# Patient Record
Sex: Male | Born: 1970 | Race: White | Hispanic: No | Smoking: Never smoker
Health system: Southern US, Community
[De-identification: ages and names within clinical notes are randomized; demographics above are authoritative.]

## PROBLEM LIST (undated history)

## (undated) DIAGNOSIS — G40909 Epilepsy, unspecified, not intractable, without status epilepticus: Secondary | ICD-10-CM

## (undated) HISTORY — PX: APPENDECTOMY: SHX54

## (undated) HISTORY — PX: FEMUR FRACTURE SURGERY: SHX633

## (undated) HISTORY — PX: KNEE SURGERY: SHX244

---

## 2008-05-20 ENCOUNTER — Emergency Department (HOSPITAL_BASED_OUTPATIENT_CLINIC_OR_DEPARTMENT_OTHER): Admission: EM | Admit: 2008-05-20 | Discharge: 2008-05-20 | Payer: Self-pay | Admitting: Emergency Medicine

## 2008-10-19 ENCOUNTER — Other Ambulatory Visit: Payer: Self-pay | Admitting: Emergency Medicine

## 2008-10-20 ENCOUNTER — Inpatient Hospital Stay (HOSPITAL_COMMUNITY): Admission: EM | Admit: 2008-10-20 | Discharge: 2008-10-26 | Payer: Self-pay | Admitting: *Deleted

## 2008-10-20 ENCOUNTER — Ambulatory Visit: Payer: Self-pay | Admitting: *Deleted

## 2009-09-19 ENCOUNTER — Emergency Department (HOSPITAL_COMMUNITY): Admission: EM | Admit: 2009-09-19 | Discharge: 2009-09-19 | Payer: Self-pay | Admitting: Emergency Medicine

## 2010-07-04 LAB — DIFFERENTIAL
Eosinophils Relative: 6 % — ABNORMAL HIGH (ref 0–5)
Lymphocytes Relative: 28 % (ref 12–46)
Lymphs Abs: 2.2 10*3/uL (ref 0.7–4.0)
Neutro Abs: 4.4 10*3/uL (ref 1.7–7.7)

## 2010-07-04 LAB — COMPREHENSIVE METABOLIC PANEL
ALT: 43 U/L (ref 0–53)
Alkaline Phosphatase: 146 U/L — ABNORMAL HIGH (ref 39–117)
CO2: 23 mEq/L (ref 19–32)
Calcium: 9.2 mg/dL (ref 8.4–10.5)
Chloride: 107 mEq/L (ref 96–112)
Creatinine, Ser: 0.7 mg/dL (ref 0.4–1.5)
GFR calc Af Amer: >60 mL/min (ref >60)
GFR calc non Af Amer: >60 mL/min (ref >60)
Total Protein: 6.9 g/dL (ref 6.0–8.3)

## 2010-07-04 LAB — CBC
MCV: 92.8 fL (ref 78.0–100.0)
Platelets: 285 10*3/uL (ref 150–400)
RBC: 4.21 MIL/uL — ABNORMAL LOW (ref 4.22–5.81)
RDW: 14.9 % (ref 11.5–15.5)
WBC: 7.8 10*3/uL (ref 4.0–10.5)

## 2010-07-04 LAB — RAPID URINE DRUG SCREEN, HOSP PERFORMED
Amphetamines: NOT DETECTED
Benzodiazepines: NOT DETECTED

## 2010-07-04 LAB — PHENYTOIN LEVEL, TOTAL: Phenytoin Lvl: 6.4 ug/mL — ABNORMAL LOW (ref 10.0–20.0)

## 2010-07-04 LAB — ETHANOL: Alcohol, Ethyl (B): <5 mg/dL (ref 0–10)

## 2010-07-24 LAB — VALPROIC ACID LEVEL
Valproic Acid Lvl: 133.1 ug/mL — ABNORMAL HIGH (ref 50.0–100.0)
Valproic Acid Lvl: 93.5 ug/mL (ref 50.0–100.0)

## 2010-07-24 LAB — POCT TOXICOLOGY PANEL

## 2010-07-24 LAB — HEPATIC FUNCTION PANEL
ALT: 17 U/L (ref 0–53)
Indirect Bilirubin: 0.5 mg/dL (ref 0.3–0.9)
Total Bilirubin: 0.7 mg/dL (ref 0.3–1.2)

## 2010-07-24 LAB — CBC
Hemoglobin: 15 g/dL (ref 13.0–17.0)
MCHC: 34.4 g/dL (ref 30.0–36.0)
RBC: 4.44 MIL/uL (ref 4.22–5.81)
WBC: 10.3 10*3/uL (ref 4.0–10.5)

## 2010-07-24 LAB — BASIC METABOLIC PANEL
CO2: 22 mEq/L (ref 19–32)
Calcium: 9.5 mg/dL (ref 8.4–10.5)
Chloride: 102 mEq/L (ref 96–112)
Creatinine, Ser: 0.8 mg/dL (ref 0.4–1.5)
GFR calc non Af Amer: >60 mL/min (ref >60)
Glucose, Bld: 95 mg/dL (ref 70–99)
Potassium: 4.4 mEq/L (ref 3.5–5.1)
Sodium: 136 mEq/L (ref 135–145)

## 2010-07-24 LAB — DIFFERENTIAL
Lymphs Abs: 2.8 10*3/uL (ref 0.7–4.0)
Neutro Abs: 6.7 10*3/uL (ref 1.7–7.7)
Neutrophils Relative %: 65 % (ref 43–77)

## 2010-07-24 LAB — PHENYTOIN LEVEL, TOTAL: Phenytoin Lvl: 13.8 ug/mL (ref 10.0–20.0)

## 2010-08-02 LAB — POCT TOXICOLOGY PANEL

## 2010-08-02 LAB — COMPREHENSIVE METABOLIC PANEL
ALT: 30 U/L (ref 0–53)
CO2: 27 mEq/L (ref 19–32)
Chloride: 93 mEq/L — ABNORMAL LOW (ref 96–112)
GFR calc Af Amer: >60 mL/min (ref >60)
GFR calc non Af Amer: >60 mL/min (ref >60)
Glucose, Bld: 90 mg/dL (ref 70–99)
Total Bilirubin: 0.4 mg/dL (ref 0.3–1.2)

## 2010-08-02 LAB — DIFFERENTIAL
Basophils Absolute: 0 10*3/uL (ref 0.0–0.1)
Basophils Relative: 1 % (ref 0–1)
Eosinophils Absolute: 0.1 10*3/uL (ref 0.0–0.7)
Lymphocytes Relative: 25 % (ref 12–46)
Monocytes Relative: 8 % (ref 3–12)

## 2010-08-02 LAB — CBC
HCT: 44.3 % (ref 39.0–52.0)
Hemoglobin: 15.4 g/dL (ref 13.0–17.0)
MCHC: 34.7 g/dL (ref 30.0–36.0)
MCV: 94.5 fL (ref 78.0–100.0)
Platelets: 261 10*3/uL (ref 150–400)
RDW: 13.9 % (ref 11.5–15.5)

## 2010-08-30 NOTE — H&P (Signed)
NAMEQUANTRELL, SPLITT                 ACCOUNT NO.:  [id number]   MEDICAL RECORD NO.:  [id number]          PATIENT TYPE:  IPS   LOCATION:  0406                          FACILITY:  [facility]   PHYSICIAN:  Jasmine Pang, M.D. DATE OF BIRTH:  August 14, 1970   DATE OF ADMISSION:  10/20/2008  DATE OF DISCHARGE:                       PSYCHIATRIC ADMISSION ASSESSMENT   IDENTIFICATION:  40 year old male.  This is a voluntary admission.   HISTORY OF PRESENT ILLNESS:  First [facility] admission for this 40 year old  who presents on complaining of suicidal thoughts for about 2 days with  various plans going through his mind possibly walking out in front of a  car, cutting his wrist.  He says that he has a history of previous  attempts about four times.  Says his acute depression started after  arguing with his wife yesterday, has also drunk some alcohol but is  unclear about how much he drank.  His last alcohol use was about 3 days  ago but declines to give any specifics about his pattern.  He reports he  has been taking all of his medications on a regular basis and endorses  depression, relationship problems and suicidal thoughts without any  immediate plan today.  He endorsed hearing some vague hallucinations the  nature of which he has not been specific about.   PAST PSYCHIATRIC HISTORY:  First [facility] admission.  He is followed as an  outpatient at [facility] in [place].  He reports a  history of four previous suicide attempts and endorses a history of at  least one previous admission at [facility].  Alcohol use pattern and length of abstinent periods is unclear.  He is a  nonsmoker and has denied any history of drug abuse.  No history of IV  drug abuse.   SOCIAL HISTORY:  Married and living with family.  He reports that he is  unemployed.  He has Medicaid resources to pay for treatment.  No known  legal problems.   MEDICAL HISTORY:  Primary care physician is Dr.  Dwaine Gale,  neurologist.  Medical problems are seizure disorder, hypothyroidism.   CURRENT MEDICATIONS:  Were validated verbally with the patient  1. Benztropine 1 mg b.i.d.  2. Depakote 500 mg 6 tablets every 12 hours.  3. Dilantin 200 mg p.o. q.a.m. and 300 mg p.o. q.p.m.  4. Levothyroxine 112 mcg daily.  5. Nexium 20 mg daily.  6. Provigil 200 mg p.o. daily.  7. Prozac 20 mg daily.  8. Risperdal 5 mg daily.   DRUG ALLERGIES:  CODEINE.   PHYSICAL EXAMINATION:  Physical exam was done in the emergency room as  noted in the record.  He is a fully alert male in no acute distress.  Temperature 98.7, pulse 81, respirations 16, blood pressure 126/81.  His  valproate level 133.1 and Dilantin level 13.8.  Chemistry normal.  Alcohol level less than 5.  Urine drug screen negative for all  substances.  CBC within normal limits.   MENTAL STATUS EXAM:  Reveals a fully alert male, appears depressed, poor  eye  contact, lying in the bed, does respond readily, polite,  cooperative.  Admits to being quite depressed, having a lot of  relationship issues.  Admits suicidal thoughts.  Feels that he can be  safe here, no immediate plan to harm himself, contracting for safety on  the unit, he is fully oriented, in full contact with reality with normal  speech.  Gives a coherent history, detailed information about  medications.  No evidence of thought disorder.  No homicidal thoughts.  AXIS I:  Depressive disorder NOS.  Rule out psychosis.  AXIS II:  Deferred.  AXIS III:  Seizure disorder, hypothyroid.  AXIS IV:  Severe issues with relationships.  AXIS V:  Current 42, past year not known.   PLAN:  Voluntarily admit him, going to continue his current medications  and have put him on Librium protocol.  He did require some Ativan in the  emergency room, will put him on some Ativan for safety.  He is on  seizure precautions and we will get some additional history when he  feels more up to  talking.   We are holding his Provigil for now.      Margaret A. Lorin Picket, N.P.      Jasmine Pang, M.D.  Electronically Signed    MAS/MEDQ  D:  10/20/2008  T:  10/20/2008  Job:  [id number]

## 2010-08-30 NOTE — Discharge Summary (Signed)
NAMEASER, NYLUND                 ACCOUNT NO.:  [id number]   MEDICAL RECORD NO.:  [id number]          PATIENT TYPE:  IPS   LOCATION:  0306                          FACILITY:  [facility]   PHYSICIAN:  Jasmine Pang, M.D. DATE OF BIRTH:  1971-01-27   DATE OF ADMISSION:  10/20/2008  DATE OF DISCHARGE:  10/26/2008                               DISCHARGE SUMMARY   ADDENDUM   HOSPITAL COURSE:  Initially, the patient seemed confused.  He stated, he  felt tired and wobbly.  He was depressed and anxious.  He was  endorsing auditory hallucinations, I hear my daddy.  There was poverty  of thought.  On October 23, 2008, the counselor met with the patient's wife  and community Environmental manager.  The patient's wife discussed recent  conflicts between them.  Wife expressed concern that the patient was  ready to come home and that if he did he would probably end up back in  the hospital within 2-3 days.  The patient initially was angry about  this, but then agreed that he needs a longer term program and began to  talk about a 60 to 90-day program being the best option.  On October 22, 2008, there was some sedation on his medications.  His sleep was good  and appetite was good.  He still was depressed and anxious.  On October 24, 2008, the patient reported he was okay.  He denied suicidal or  homicidal ideation or auditory or visual hallucination.  He reports  sleep and appetite is good.  He stated I want to go home, multiple  times.  He appeared to no longer want to go to a long-term program.  On  October 26, 2008, mental status had continue to improve.  He was up walking  around.  He was still somewhat disheveled, but this appeared to be at  baseline for him.  He had good eye contact.  He was less depressed, less  irritable.  Affect was consistent with mood.  No suicidal or homicidal  ideation.  No thoughts of self-injurious behavior.  No auditory or  visual hallucinations.  No paranoia or delusions.  Thoughts were  logical  and goal-directed.  Thought content, no predominant theme.  Cognitive  was grossly back to baseline.  The patient wanted to go home today.  He  no longer wanted to go to a 60 to 90-day treatment program.  He was felt  to be safe for discharge.   DISCHARGE DIAGNOSES:  Axis I: Mood disorder, not otherwise specified.  Axis II:  Personality disorder, not otherwise specified  Axis III:  Seizure disorder.  Hypothyroidism.  Axis IV: Severe (issues with relationship, burden of psychiatric  illness, burden of medical problems).  Axis V: Global assessment of functioning was 50 upon discharge.  GAF was  44 upon admission.  GAF highest past year was 55-60.   DISCHARGE PLAN:  There were no specific activity level or dietary  restrictions.   POSTHOSPITAL CARE PLANS:  The patient will go to the [facility] on  October 28, 2008  at 9:30 a.m.   DISCHARGE MEDICATIONS:  1. Cogentin 1 mg twice a day.  2. Levothyroxine 112 mcg daily.  3. Prozac 20 mg daily.  4. Nexium 20 mg daily.  5. Risperdal 4 mg at bedtime.  6. Depakote 500 mg 6 tablets in the morning and 6 tablets in the      evening.  7. Trazodone 50 mg at bedtime if needed.  8. Dilantin as prescribed.   He is not to take the Provigil anymore.      Jasmine Pang, M.D.  Electronically Signed     BHS/MEDQ  D:  10/26/2008  T:  10/27/2008  Job:  [id number]

## 2010-08-30 NOTE — Discharge Summary (Signed)
NAMEANDREZ, LIEURANCE                 ACCOUNT NO.:  [id number]   MEDICAL RECORD NO.:  [id number]          PATIENT TYPE:  IPS   LOCATION:  0306                          FACILITY:  [facility]   PHYSICIAN:  Jasmine Pang, M.D. DATE OF BIRTH:  June 06, 1970   DATE OF ADMISSION:  10/20/2008  DATE OF DISCHARGE:  10/26/2008                               DISCHARGE SUMMARY   IDENTIFICATION:  This is a 40 year old married white male who was  admitted on a voluntary basis on October 20, 2008.   HISTORY OF PRESENT ILLNESS:  This is the first [facility] admission for this 34-  year-old who presents complaining of suicidal ideation for about 2 days.  He has had various plans going through his mind possibly walking in  front of a car or cutting his wrist.  He says he has a history of  previous attempts about 4 times.  He says his acute depression started  after arguing with his wife yesterday.  He has also drunk some alcohol,  but it is unclear how much he drank.  His last alcohol use was about 3  days ago, but he declines to give any specifics about this pattern.  He  reports he has been taking all of his medications on a regular basis.  He endorses depression, relationship problems, and suicidal thoughts  without any immediate plan today.  He endorse hearing some vague  hallucinations the nature of which he has not been specific about.  This  is the patient's first [facility] admission.  He is followed as an outpatient  at [facility] in [place].  He reports a history of  4 previous suicide attempts and endorses a history of at least 1  previous admissions at [facility].  The alcohol  use pattern linked to the abstinent periods is unclear.  He is a  nonsmoker and has denied any history of drug abuse.  No history of IV  drug abuse.  For further admission information, see psychiatric  admission assessment.   DIAGNOSES:  Axis I:  The patient was given the diagnosis of depressive  disorder, not otherwise specified; and rule out psychosis.  Axis III:  He was given a diagnosis of seizure disorder and  hypothyroidism.   PHYSICAL FINDINGS:  Physical exam was done in the emergency room.  There  were no acute physical or medical problems noted.   DIAGNOSTIC STUDIES:  Valproate level was 133.1, dilantin level was 13.8.  Chemistries were normal.  CBC was within normal limits.  Alcohol level  was less than 5.  Urine drug screen was negative for all substances.   HOSPITAL COURSE:  Upon admission, the patient was started on trazodone  50 mg p.o. q.h.s. p.r.n. insomnia may repeat x1.  He was also started on  the Librium detox protocol.  He was restarted on his outpatient  medications of benztropine 1 mg p.o. b.i.d., Depakote 500 mg p.o. 6  tablets in the morning and 6 tablets at p.m., Dilantin 200 mg in the  morning and 300 mg at p.m., levothyroxine  112 mcg p.o. daily, Nexium 20  mg p.o. daily, Provigil 200 mg p.o. daily, Prozac 20 mg p.o. daily, and  Risperdal 4 mg p.o. daily.  On October 20, 2008, the Provigil was  discontinued.  He was also started on nicotine gum 2 mg q.4 h. while  awake and may have an additional 2 mg piece of gum q.2 h. p.r.n. for  nicotine cravings.    DICTATION ENDED AT THIS POINT      Jasmine Pang, M.D.  Electronically Signed     BHS/MEDQ  D:  10/26/2008  T:  10/27/2008  Job:  [id number]

## 2015-03-26 ENCOUNTER — Emergency Department (HOSPITAL_BASED_OUTPATIENT_CLINIC_OR_DEPARTMENT_OTHER): Payer: Medicare Other

## 2015-03-26 ENCOUNTER — Emergency Department (HOSPITAL_BASED_OUTPATIENT_CLINIC_OR_DEPARTMENT_OTHER)
Admission: EM | Admit: 2015-03-26 | Discharge: 2015-03-26 | Disposition: A | Discharge status: Home / Self Care | Payer: Medicare Other | Attending: Emergency Medicine | Admitting: Emergency Medicine

## 2015-03-26 ENCOUNTER — Encounter (HOSPITAL_BASED_OUTPATIENT_CLINIC_OR_DEPARTMENT_OTHER): Payer: Self-pay

## 2015-03-26 DIAGNOSIS — J069 Acute upper respiratory infection, unspecified: Secondary | ICD-10-CM | POA: Insufficient documentation

## 2015-03-26 DIAGNOSIS — G40909 Epilepsy, unspecified, not intractable, without status epilepticus: Secondary | ICD-10-CM | POA: Insufficient documentation

## 2015-03-26 DIAGNOSIS — B9789 Other viral agents as the cause of diseases classified elsewhere: Secondary | ICD-10-CM

## 2015-03-26 DIAGNOSIS — R05 Cough: Secondary | ICD-10-CM | POA: Diagnosis present

## 2015-03-26 HISTORY — DX: Epilepsy, unspecified, not intractable, without status epilepticus: G40.909

## 2015-03-26 MED ORDER — GUAIFENESIN 100 MG/5ML PO LIQD: 100.0000 mg | ORAL | Status: DC | PRN

## 2015-03-26 MED ORDER — OXYMETAZOLINE HCL 0.05 % NA SOLN: 1.0000 | Freq: Two times a day (BID) | NASAL | Status: AC | PRN

## 2015-03-26 MED ORDER — PSEUDOEPHEDRINE HCL 60 MG PO TABS: 60.0000 mg | ORAL_TABLET | Freq: Four times a day (QID) | ORAL | Status: DC | PRN

## 2015-03-26 NOTE — ED Notes (Signed)
C/o sinus congestion, cough, sore throat  X 2 days

## 2015-03-26 NOTE — Discharge Instructions (Signed)

## 2015-03-26 NOTE — ED Provider Notes (Signed)
CSN: [id number]     Arrival date & time 03/26/15  1849 History  By signing my name below, I, Doreatha MartinEva Mathews, attest that this documentation has been prepared under the direction and in the presence of Lyndal Pulleyaniel Maybelle Depaoli, MD. Electronically Signed: Doreatha MartinEva Mathews, ED Scribe. 03/26/2015. 7:30 PM.    Chief Complaint  Patient presents with  . URI   Patient is a 44 y.o. male presenting with URI. The history is provided by the patient. No language interpreter was used.  URI Presenting symptoms: congestion, cough and sore throat   Presenting symptoms: no fever   Cough:    Cough characteristics:  Productive   Sputum characteristics:  White   Duration:  2 days Severity:  Moderate Onset quality:  Gradual Duration:  2 days Timing:  Constant Progression:  Worsening Chronicity:  New Relieved by:  OTC medications Worsened by:  Certain positions Ineffective treatments: saline rinse. Associated symptoms: no arthralgias and no wheezing   Risk factors: no immunosuppression    HPI Comments: Madison HickmanReece Himebaugh is a 44 y.o. male who presents to the Emergency Department complaining of moderate nasal congestion onset 2 days ago with associated productive cough with white phlegm, sore throat. Pt states he tried to rinse his nose with saltwater with moderate relief. He notes he has tried OTC cold medications and Tylenol with no relief. He states cough is worsened when he lies flat. He denies known fevers, SOB.   Past Medical History  Diagnosis Date  . Epilepsy [facility](HCC)    Past Surgical History  Procedure Laterality Date  . Appendectomy     No family history on file. Social History  Substance Use Topics  . Smoking status: Never Smoker   . Smokeless tobacco: None  . Alcohol Use: No    Review of Systems  Constitutional: Negative for fever.  HENT: Positive for congestion and sore throat.   Respiratory: Positive for cough. Negative for shortness of breath and wheezing.   Musculoskeletal: Negative for arthralgias.   All other systems reviewed and are negative.  Allergies  Codeine  Home Medications   Prior to Admission medications   Medication Sig Start Date End Date Taking? Authorizing Provider  LevETIRAcetam (KEPPRA PO) Take by mouth.   Yes Historical Provider, MD   BP 120/81 mmHg  Pulse 79  Temp(Src) 99.8 F (37.7 C) (Oral)  Resp 20  Ht 6' (1.829 m)  Wt 205 lb (92.987 kg)  BMI 27.80 kg/m2  SpO2 98% Physical Exam  Constitutional: He is oriented to person, place, and time. He appears well-developed and well-nourished. No distress.  HENT:  Head: Normocephalic and atraumatic.  Eyes: Conjunctivae are normal.  Neck: Neck supple. No tracheal deviation present.  Cardiovascular: Normal rate, regular rhythm, S1 normal and S2 normal.   Pulmonary/Chest: Effort normal. No respiratory distress. He has no wheezes. He has rales.  Left basilar rales.   Abdominal: Soft. He exhibits no distension.  Neurological: He is alert and oriented to person, place, and time.  Skin: Skin is warm and dry.  Psychiatric: He has a normal mood and affect.   ED Course  Procedures (including critical care time) DIAGNOSTIC STUDIES: Oxygen Saturation is 98% on RA, normal by my interpretation.    COORDINATION OF CARE: 7:18 PM Discussed treatment plan with pt at bedside and pt agreed to plan. Will XR chest to r/o pneumonia.    Imaging Review Dg Chest 2 View  03/26/2015  CLINICAL DATA:  Fever and cough EXAM: CHEST  2 VIEW COMPARISON:  None. FINDINGS: There is scarring in the left base region. Lungs elsewhere clear. Heart size and pulmonary vascularity are normal. No adenopathy. No bone lesions. IMPRESSION: Scarring left base region.  No edema or consolidation. Electronically Signed   By: Bretta Bang III M.D.   On: 03/26/2015 19:38   I have personally reviewed and evaluated these images as part of my medical decision-making.  MDM   Final diagnoses:  Viral URI with cough    44 year old male presents with 2  days of upper respiratory symptoms. He has focal rales in the left lower lung field but is otherwise well-appearing. Low-grade fever on arrival. Left base is consistent with scarring on plain film, no evidence of consolidated infection. Plan for oral decongestants, expectorants, and nasal spray as needed. Plan to follow up with PCP as needed and return precautions discussed for worsening or new concerning symptoms.   I personally performed the services described in this documentation, which was scribed in my presence. The recorded information has been reviewed and is accurate.     Lyndal Pulley, MD 03/26/15 [phone]

## 2016-05-05 ENCOUNTER — Encounter (HOSPITAL_BASED_OUTPATIENT_CLINIC_OR_DEPARTMENT_OTHER): Payer: Self-pay

## 2016-05-05 ENCOUNTER — Emergency Department (HOSPITAL_BASED_OUTPATIENT_CLINIC_OR_DEPARTMENT_OTHER): Payer: Medicare Other

## 2016-05-05 ENCOUNTER — Emergency Department (HOSPITAL_BASED_OUTPATIENT_CLINIC_OR_DEPARTMENT_OTHER)
Admission: EM | Admit: 2016-05-05 | Discharge: 2016-05-06 | Disposition: A | Discharge status: Home / Self Care | Payer: Medicare Other | Attending: Emergency Medicine | Admitting: Emergency Medicine

## 2016-05-05 ENCOUNTER — Other Ambulatory Visit: Payer: Self-pay

## 2016-05-05 DIAGNOSIS — R0789 Other chest pain: Secondary | ICD-10-CM | POA: Diagnosis not present

## 2016-05-05 DIAGNOSIS — R05 Cough: Secondary | ICD-10-CM | POA: Diagnosis not present

## 2016-05-05 DIAGNOSIS — Z79899 Other long term (current) drug therapy: Secondary | ICD-10-CM | POA: Insufficient documentation

## 2016-05-05 DIAGNOSIS — R059 Cough, unspecified: Secondary | ICD-10-CM

## 2016-05-05 DIAGNOSIS — F17200 Nicotine dependence, unspecified, uncomplicated: Secondary | ICD-10-CM | POA: Insufficient documentation

## 2016-05-05 DIAGNOSIS — R072 Precordial pain: Secondary | ICD-10-CM | POA: Diagnosis present

## 2016-05-05 LAB — BASIC METABOLIC PANEL
ANION GAP: 11 (ref 5–15)
BUN: 24 mg/dL — ABNORMAL HIGH (ref 6–20)
CO2: 22 mmol/L (ref 22–32)
CREATININE: 0.89 mg/dL (ref 0.61–1.24)
Calcium: 9.5 mg/dL (ref 8.9–10.3)
Chloride: 99 mmol/L — ABNORMAL LOW (ref 101–111)
GFR calc non Af Amer: >60 mL/min (ref >60)
Glucose, Bld: 99 mg/dL (ref 65–99)
Potassium: 4.1 mmol/L (ref 3.5–5.1)
SODIUM: 132 mmol/L — AB (ref 135–145)

## 2016-05-05 LAB — TROPONIN I

## 2016-05-05 LAB — CBC WITH DIFFERENTIAL/PLATELET
BASOS ABS: 0 10*3/uL (ref 0.0–0.1)
BASOS PCT: 0 %
EOS ABS: 0.2 10*3/uL (ref 0.0–0.7)
Eosinophils Relative: 2 %
HEMATOCRIT: 41.6 % (ref 39.0–52.0)
HEMOGLOBIN: 14.8 g/dL (ref 13.0–17.0)
Lymphocytes Relative: 24 %
Lymphs Abs: 3.1 10*3/uL (ref 0.7–4.0)
MCH: 31 pg (ref 26.0–34.0)
MCHC: 35.6 g/dL (ref 30.0–36.0)
MCV: 87 fL (ref 78.0–100.0)
Monocytes Absolute: 1 10*3/uL (ref 0.1–1.0)
Monocytes Relative: 8 %
NEUTROS ABS: 8.3 10*3/uL — AB (ref 1.7–7.7)
NEUTROS PCT: 66 %
Platelets: 305 10*3/uL (ref 150–400)
RBC: 4.78 MIL/uL (ref 4.22–5.81)
RDW: 12.9 % (ref 11.5–15.5)
WBC: 12.6 10*3/uL — AB (ref 4.0–10.5)

## 2016-05-05 MED ORDER — KETOROLAC TROMETHAMINE 30 MG/ML IJ SOLN
15.0000 mg | Freq: Once | INTRAMUSCULAR | Status: AC
  Administered 2016-05-05: 15 mg via INTRAVENOUS
  Filled 2016-05-05: qty 1

## 2016-05-05 MED ORDER — GI COCKTAIL ~~LOC~~
30.0000 mL | Freq: Once | ORAL | Status: AC
  Administered 2016-05-05: 30 mL via ORAL
  Filled 2016-05-05: qty 30

## 2016-05-05 NOTE — ED Provider Notes (Signed)
[facility] Provider Note   CSN: [id number] Arrival date & time: 05/05/16  1755  By signing my name below, I, William Goodman, attest that this documentation has been prepared under the direction and in the presence of [address][name], [place]. Electronically Signed: [name] Goodman, ED Scribe. 05/05/16. 9:41 PM.  History   Chief Complaint Chief Complaint  Patient presents with  . Chest Pain   The history is provided by the patient and medical records. No language interpreter was used.  Chest Pain   This is a new problem. The current episode started yesterday. Episode frequency: intermittent. The problem has not changed since onset.The pain is associated with coughing. The pain is present in the substernal region. The pain is at a severity of 9/10. The pain is moderate. The quality of the pain is described as sharp. Radiates to: L lateral chest. Duration of episode(s) is 1 day. The symptoms are aggravated by deep breathing (and coughing). Associated symptoms include cough and sputum production (yellow-white). Pertinent negatives include no abdominal pain, no claudication, no diaphoresis, no fever, no hemoptysis, no lower extremity edema, no nausea, no numbness, no orthopnea, no shortness of breath, no vomiting and no weakness. Treatments tried: ibuprofen. The treatment provided moderate relief. Risk factors include male gender.  Pertinent negatives for past medical history include no CAD, no diabetes, no DVT, no hyperlipidemia, no hypertension, no MI and no PE.  Pertinent negatives for family medical history include: no CAD, no early MI and no PE.   HPI Comments: William HickmanReece Goodman is a 46 y.o. male with a PMHx of seizures, who presents to the Emergency Department complaining of gradual onset chest pain onset yesterday while laying in bed at rest, accompanied by a cough with yellow-white sputum production. Pt describes his pain as 9/10, intermittent, sharp central chest pain radiating to left side of  his ribs/chest, exacerbated with deep inhalation, coughing, laughing, and moving, that is relieved temporarily with 800 mg of Ibuprofen. He states coughing began around the same time as chest pain. States he "thinks [he] pulled a rib muscle". Pt denies recent travel, surgeries, or prolonged immobilization. Denies personal or family Hx of PE/DVT, CAD, MI, or cardiac disease. Pt does not smoke. Denies recent sick contact. Denies fevers, chills, diaphoresis, lightheadedness, SOB, wheezing, hemoptysis, sore throat, rhinorrhea, ear pain or drainage, leg swelling, abd pain, N/V/D/C, hematuria, dysuria, myalgias, arthralgias, claudication, orthopnea, numbness, tingling, weakness, or any other complaints at this time.    Past Medical History:  Diagnosis Date  . Epilepsy (HCC)     There are no active problems to display for this patient.   Past Surgical History:  Procedure Laterality Date  . APPENDECTOMY    . FEMUR FRACTURE SURGERY    . KNEE SURGERY        Home Medications    Prior to Admission medications   Medication Sig Start Date End Date Taking? Authorizing Provider  Sertraline HCl (ZOLOFT PO) Take by mouth.   Yes Historical Provider, MD  LevETIRAcetam (KEPPRA PO) Take by mouth.    Historical Provider, MD    Family History No family history on file.  Social History Social History  Substance Use Topics  . Smoking status: Never Smoker  . Smokeless tobacco: Current User    Types: Chew  . Alcohol use No     Allergies   Codeine   Review of Systems Review of Systems  Constitutional: Negative for chills, diaphoresis and fever.  HENT: Negative for ear discharge, ear pain, rhinorrhea  and sore throat.   Respiratory: Positive for cough and sputum production (yellow-white). Negative for hemoptysis, shortness of breath and wheezing.   Cardiovascular: Positive for chest pain. Negative for orthopnea, claudication and leg swelling.  Gastrointestinal: Negative for abdominal pain,  constipation, diarrhea, nausea and vomiting.  Genitourinary: Negative for dysuria and hematuria.  Musculoskeletal: Negative for arthralgias and myalgias.  Skin: Negative for color change.  Allergic/Immunologic: Negative for immunocompromised state.  Neurological: Negative for weakness, light-headedness and numbness.  Psychiatric/Behavioral: Negative for confusion.  10 Systems reviewed and are negative for acute change except as noted in the HPI.   Physical Exam Updated Vital Signs BP 120/75 (BP Location: Right Arm)   Pulse 68   Temp 98 F (36.7 C) (Oral)   Resp 12   Ht 6' (1.829 m)   Wt 205 lb (93 kg)   SpO2 98%   BMI 27.80 kg/m   Physical Exam  Constitutional: He is oriented to person, place, and time. Vital signs are normal. He appears well-developed and well-nourished.  Non-toxic appearance. No distress.  Afebrile, nontoxic, NAD  HENT:  Head: Normocephalic and atraumatic.  Mouth/Throat: Oropharynx is clear and moist and mucous membranes are normal.  Eyes: Conjunctivae and EOM are normal. Right eye exhibits no discharge. Left eye exhibits no discharge.  Neck: Normal range of motion. Neck supple.  Cardiovascular: Normal rate, regular rhythm, normal heart sounds and intact distal pulses.  Exam reveals no gallop and no friction rub.   No murmur heard. RRR, nl s1/s2, no m/r/g, distal pulses intact, no pedal edema   Pulmonary/Chest: Effort normal and breath sounds normal. No respiratory distress. He has no decreased breath sounds. He has no wheezes. He has no rhonchi. He has no rales. He exhibits tenderness. He exhibits no crepitus, no deformity and no retraction.    CTAB in all lung fields, no w/r/r, no hypoxia or increased WOB, speaking in full sentences, SpO2 98% on RA  Chest wall with mild TTP to the sternum and left anterior chest, without crepitus, deformities, or retractions  Abdominal: Soft. Normal appearance and bowel sounds are normal. He exhibits no distension. There is  no tenderness. There is no rigidity, no rebound, no guarding, no CVA tenderness, no tenderness at McBurney's point and negative Murphy's sign.  Musculoskeletal: Normal range of motion.  MAE x4 Strength and sensation grossly intact in all extremities Distal pulses intact Gait steady No pedal edema, neg homan's bilaterally  Neurological: He is alert and oriented to person, place, and time. He has normal strength. No sensory deficit.  Skin: Skin is warm, dry and intact. No rash noted.  Psychiatric: He has a normal mood and affect.  Nursing note and vitals reviewed.   ED Treatments / Results  DIAGNOSTIC STUDIES: Oxygen Saturation is 98% on RA, normal by my interpretation.    COORDINATION OF CARE: 9:37 PM Discussed treatment plan with pt at bedside which includes blood work, Toradol, and GI Cocktail and pt agreed to plan.  Labs (all labs ordered are listed, but only abnormal results are displayed) Labs Reviewed  CBC WITH DIFFERENTIAL/PLATELET - Abnormal; Notable for the following:       Result Value   WBC 12.6 (*)    Neutro Abs 8.3 (*)    All other components within normal limits  BASIC METABOLIC PANEL - Abnormal; Notable for the following:    Sodium 132 (*)    Chloride 99 (*)    BUN 24 (*)    All other components within normal limits  TROPONIN I    EKG  EKG Interpretation  Date/Time:  Friday May 05 2016 18:09:32 EST Ventricular Rate:  87 PR Interval:  154 QRS Duration: 90 QT Interval:  358 QTC Calculation: 430 R Axis:   59 Text Interpretation:  Normal sinus rhythm with sinus arrhythmia Normal ECG no ischemic changes,  no prior EKG  Confirmed by LIU MD, DANA [phone]) on 05/05/2016 9:29:57 PM       Radiology Dg Chest 2 View  Result Date: 05/05/2016 CLINICAL DATA:  Chest pain for 3 days, worse with deep breathing. Productive cough for 2 days. EXAM: CHEST  2 VIEW COMPARISON:  Chest x-ray dated 03/26/2015. FINDINGS: The heart size and mediastinal contours are within  normal limits. Both lungs are clear. The visualized skeletal structures are unremarkable. IMPRESSION: No active cardiopulmonary disease. Electronically Signed   By: Bary Richard M.D.   On: 05/05/2016 18:25    Procedures Procedures (including critical care time)  Medications Ordered in ED Medications  gi cocktail (Maalox,Lidocaine,Donnatal) (30 mLs Oral Given 05/05/16 2151)  ketorolac (TORADOL) 30 MG/ML injection 15 mg (15 mg Intravenous Given 05/05/16 2152)     Initial Impression / Assessment and Plan / ED Course  I have reviewed the triage vital signs and the nursing notes.  Pertinent labs & imaging results that were available during my care of the patient were reviewed by me and considered in my medical decision making (see chart for details).     46 y.o. male here with chest wall pain and cough x1 day, denies SOB. States it hurts to breath and cough. Pt laughing and joking around throughout exam. Chest wall with reproducible tenderness on exam. PERC neg, and no RFs or exam findings concerning for PE/DVT, so doubt this as a cause. Likely musculoskeletal pain from coughing. Clear lung exam. CXR neg, EKG unremarkable without acute ischemic findings. Will get basic labs including troponin, give toradol and GI cocktail, and reassess shortly.  12:20 AM CBC w/diff with mildly elevated WBC 12.6 but differential unremarkable. BMP essentially unremarkable. Trop neg. Pt feeling much better after meds, with full resolution of chest pain. Likely musculoskeletal pain from cough; discussed use of tylenol/motrin, heat, and mucinex/OTC meds for cough. F/up with PCP in 5-7 days for recheck of symptoms. I explained the diagnosis and have given explicit precautions to return to the ER including for any other new or worsening symptoms. The patient understands and accepts the medical plan as it's been dictated and I have answered their questions. Discharge instructions concerning home care and prescriptions have  been given. The patient is STABLE and is discharged to home in good condition.   I personally performed the services described in this documentation, which was scribed in my presence. The recorded information has been reviewed and is accurate.  Final Clinical Impressions(s) / ED Diagnoses   Final diagnoses:  Chest wall pain  Cough    New Prescriptions New Prescriptions   No medications on file     [address], PA-C 05/06/16 0021    Lavera Guise, MD 05/06/16 1234

## 2016-05-05 NOTE — ED Notes (Signed)
Pt c/o mid sternal chest pain, which radiates to left side. Has pain when taking in deep breath. Onset Thursday am. States onset of pain began when lying in bed. Took 800mg  Ibuprofen last PM, with some relief.

## 2016-05-05 NOTE — ED Triage Notes (Addendum)
CP x 3 days-worse with deep breath-prod cough x 2 days-NAD-steady gait-laughing and joking with friends x 2-texting

## 2016-05-05 NOTE — ED Notes (Signed)
Pt states he felt much better after GI cocktail. Pt resting quietly, awaiting follow up with EDP for lab results

## 2016-05-05 NOTE — ED Notes (Signed)
Pt placed on cont cardiac monitor with cont POX monitoring and int NBP q1030min

## 2016-05-06 NOTE — Discharge Instructions (Signed)
Your labs, xray, and EKG are all reassuring, your chest pain could be a variety of things including a pulled muscle in your rib cage, gas pain, indigestion, musculoskeletal pain from coughing, and other nonemergent issues. Alternate between ibuprofen and tylenol as directed as needed for pain. Stay well hydrated. You may consider using heat to the areas of pain, no more than 20 minutes every hour. Continue to stay well-hydrated. Use Mucinex for cough suppression/expectoration of mucus. Use netipot and flonase to help with nasal congestion. May consider over-the-counter Benadryl or other antihistamine to decrease secretions and for help with your symptoms. Follow up with your primary care doctor in 5-7 days for recheck of ongoing symptoms. Return to emergency department for emergent changing or worsening of symptoms.  SEEK IMMEDIATE MEDICAL ATTENTION IF: You develop a fever.  Your chest pains become severe or intolerable.  You develop new, unexplained symptoms (problems).  You develop shortness of breath, nausea, vomiting, sweating or feel light headed.  You develop a new cough or you cough up blood. You develop new leg swelling

## 2018-02-25 IMAGING — CR DG CHEST 2V
2 series · 2 of 2 positions shown · non-contrast
Comparison: Chest x-ray dated 03/26/2015.

CLINICAL DATA: Chest pain for 3 days, worse with deep breathing.
Productive cough for 2 days.

EXAM:
CHEST  2 VIEW

[w chest pa]
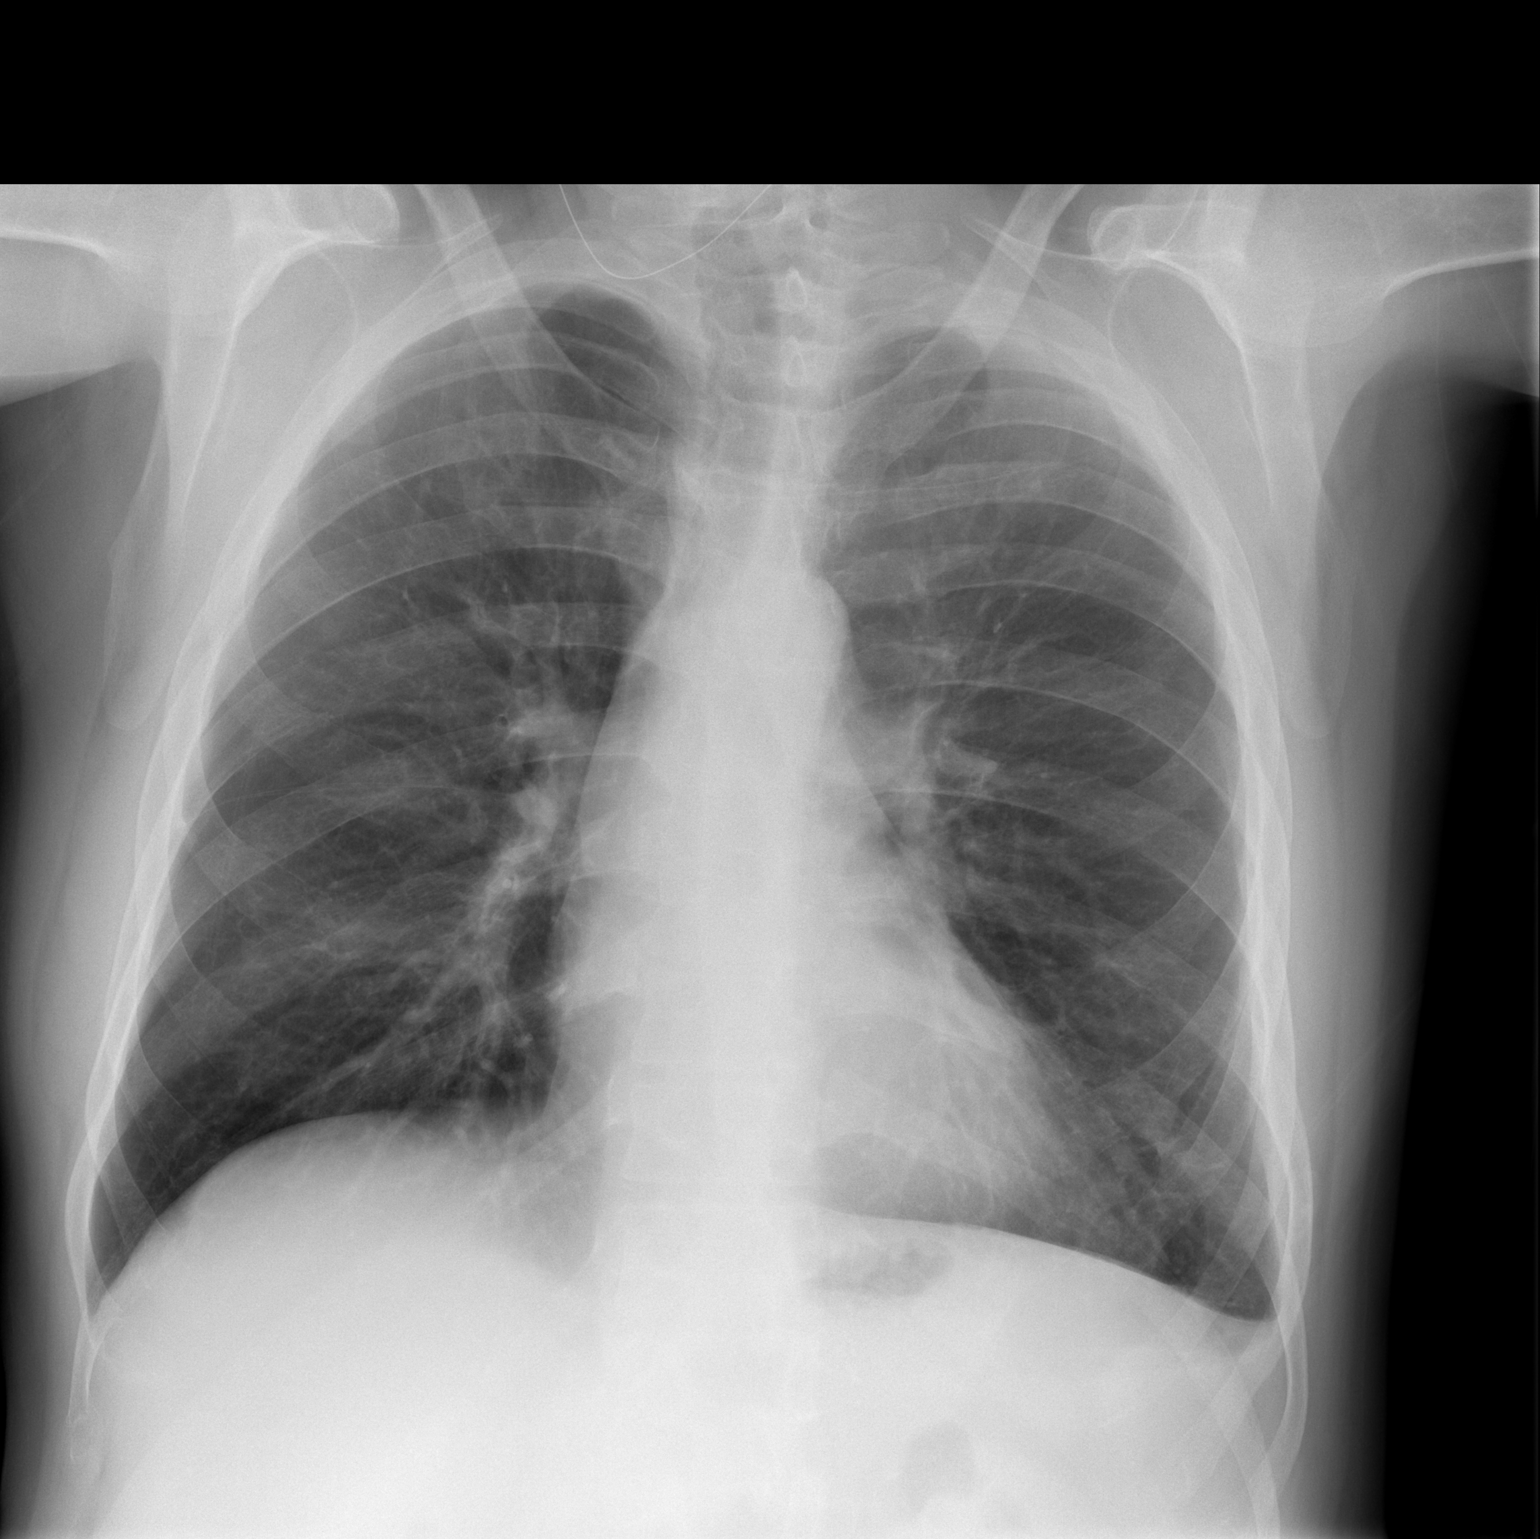

[w chest lat]
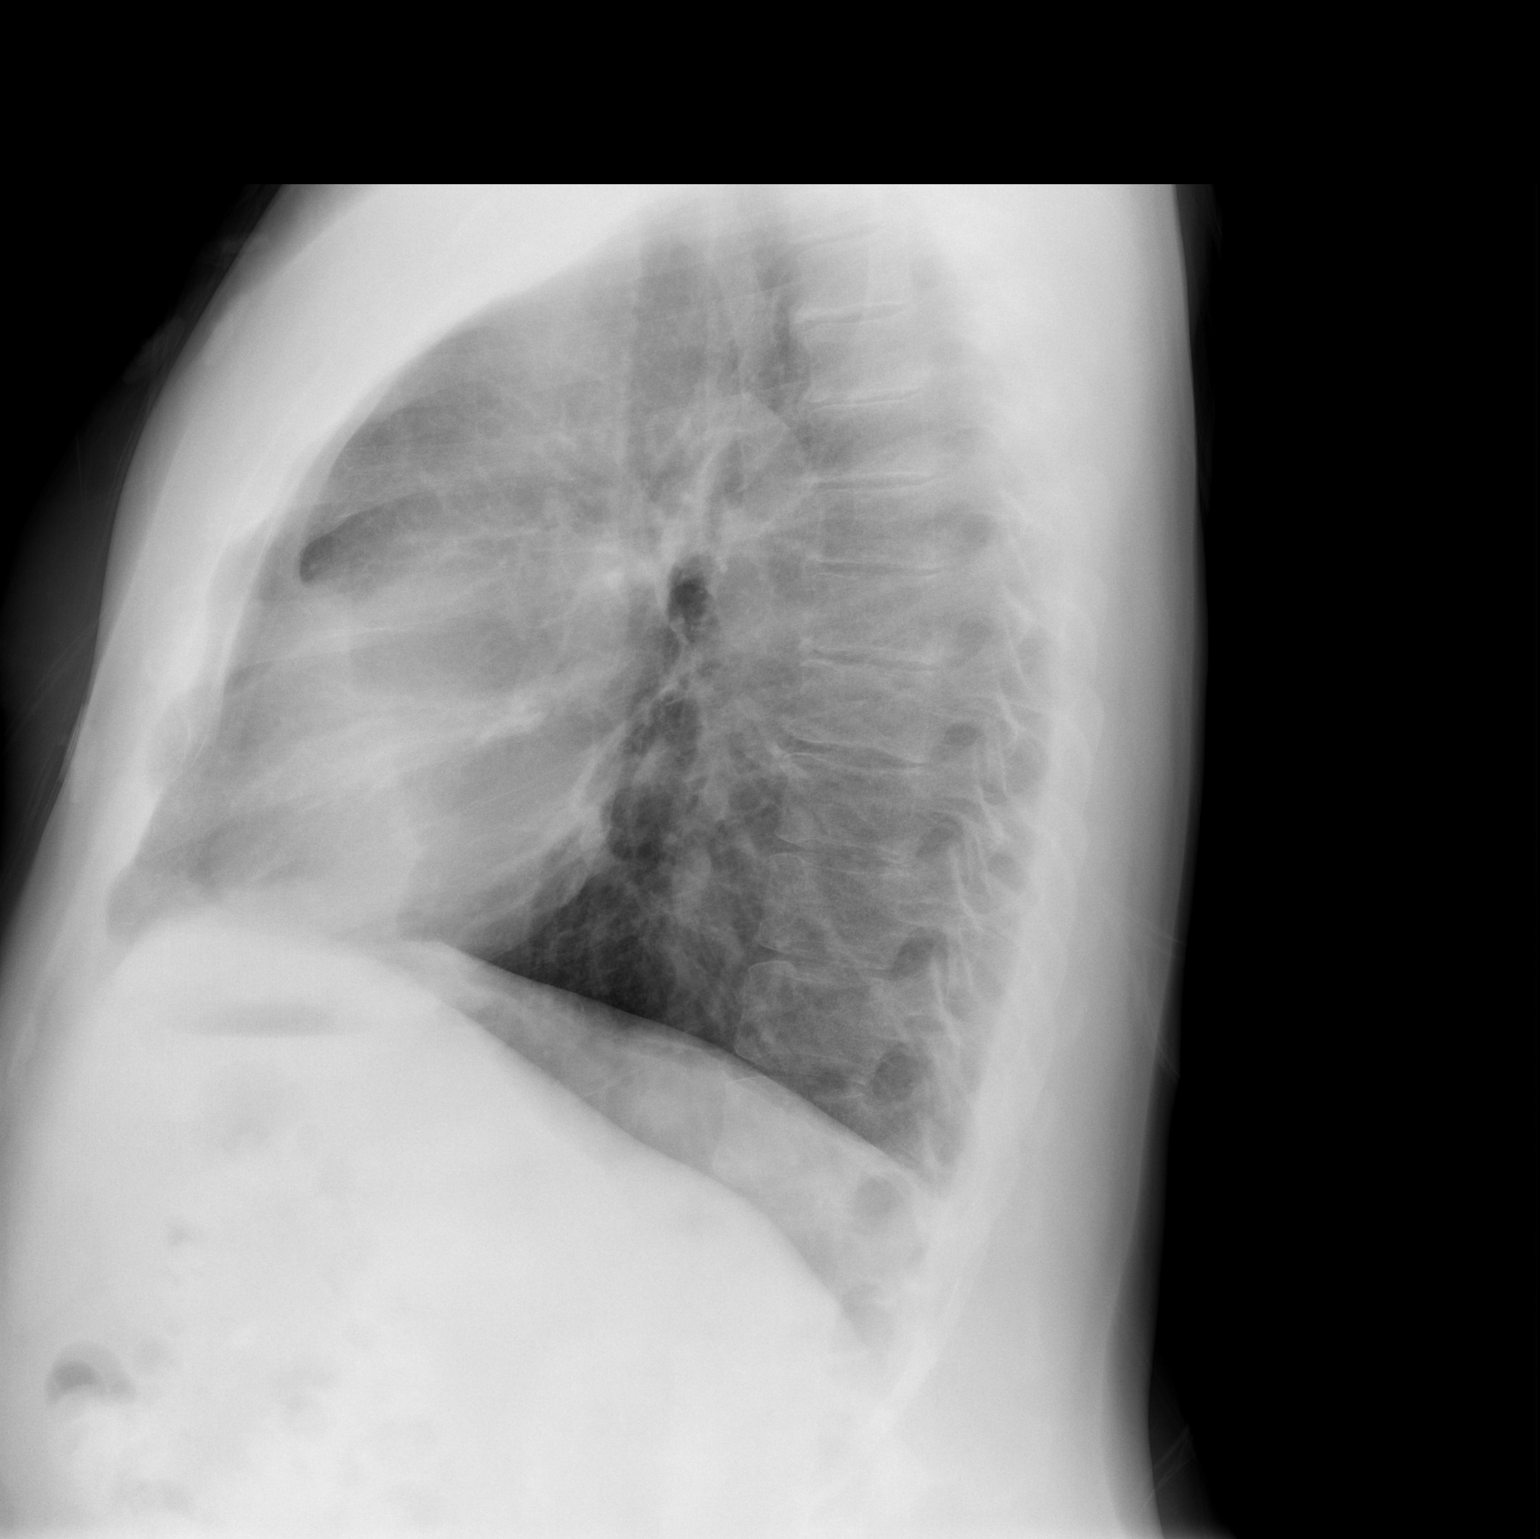

[2 of 2 positions shown; findings below may reference images not displayed]

FINDINGS: The heart size and mediastinal contours are within normal limits.
Both lungs are clear. The visualized skeletal structures are
unremarkable.
IMPRESSION: No active cardiopulmonary disease.

## 2022-10-01 ENCOUNTER — Emergency Department (HOSPITAL_BASED_OUTPATIENT_CLINIC_OR_DEPARTMENT_OTHER)
Admission: EM | Admit: 2022-10-01 | Discharge: 2022-10-01 | Disposition: A | Discharge status: Home / Self Care | Payer: Medicare Other | Attending: Emergency Medicine | Admitting: Emergency Medicine

## 2022-10-01 ENCOUNTER — Other Ambulatory Visit: Payer: Self-pay

## 2022-10-01 ENCOUNTER — Encounter (HOSPITAL_BASED_OUTPATIENT_CLINIC_OR_DEPARTMENT_OTHER): Payer: Self-pay | Admitting: Emergency Medicine

## 2022-10-01 DIAGNOSIS — Z760 Encounter for issue of repeat prescription: Secondary | ICD-10-CM | POA: Diagnosis present

## 2022-10-01 MED ORDER — SERTRALINE HCL 50 MG PO TABS: 50.0000 mg | ORAL_TABLET | Freq: Every day | ORAL | 0 refills | Status: DC

## 2022-10-01 NOTE — Discharge Instructions (Signed)
You need to make an appoint with a primary care doctor to get further refills of this medication.  I have put in a consult to our social workers to follow-up with you to ensure you have a follow-up with a primary care doctor.  If you have any thoughts of suicide, homicide, hallucinations or any other mental health concerns, go to the behavioral health center in [facility] listed in your discharge paperwork or call 911.

## 2022-10-01 NOTE — ED Provider Notes (Signed)
[place] [facility] Provider Note   CSN: [id number] Arrival date & time: 10/01/22  0250     History  Chief Complaint  Patient presents with   Medication Refill    William Goodman is a 52 y.o. male.  With PMH of epilepsy presenting with request of medication refill of sertraline 50 mg daily.  Patient brought in an empty bottle which was filled 06/21/2022 with 90 tablets.  He says he takes this medication due to the side effects of ankle from taking his seizure medications.  He currently does not have a primary care doctor but says he made an appointment in the next month and needs something to hold him over.  He denies any SI/HI/AVH.  The last person to fill his medication was in the ER doctor.  He does note having insurance.   Medication Refill      Home Medications Prior to Admission medications   Medication Sig Start Date End Date Taking? Authorizing Provider  sertraline (ZOLOFT) 50 MG tablet Take 1 tablet (50 mg total) by mouth daily. 10/01/22 10/31/22 Yes Mardene Sayer, MD  LevETIRAcetam (KEPPRA PO) Take by mouth.    [provider]  Sertraline HCl (ZOLOFT PO) Take by mouth.    [provider]      Allergies    Codeine    Review of Systems   Review of Systems  Physical Exam Updated Vital Signs Ht 6' (1.829 m)   Wt 86.2 kg   BMI 25.77 kg/m  Physical Exam Constitutional: Alert and oriented. Well appearing and in no distress. Eyes: Conjunctivae are normal. ENT      Head: Normocephalic and atraumatic. Cardiovascular: Regular rate Respiratory: Normal respiratory effort.  O2 sat 97 on RA Musculoskeletal: Normal range of motion  Neurologic: Normal speech and language.  Steady gait.  No gross focal neurologic deficits are appreciated. Skin: Skin is warm, dry  Psychiatric: Mood and affect are normal. Speech and behavior are normal.  ED Results / Procedures / Treatments   Labs (all labs ordered are listed, but  only abnormal results are displayed) Labs Reviewed - No data to display  EKG None  Radiology No results found.  Procedures Procedures    Medications Ordered in ED Medications - No data to display  ED Course/ Medical Decision Making/ A&P                             Medical Decision Making William Goodman is a 52 y.o. male.  With PMH of epilepsy presenting with request of medication refill of sertraline 50 mg daily.  Patient brought in an empty bottle which was filled 06/21/2022 with 90 tablets.  Patient has presented to the ER for refill of sertraline 50 mg.  Based on his previous fill, he has been taking the medication as prescribed and appropriately.  He does not have a PCP.  He reports making an appointment with a PCP but I have also put in a consult to social work to make sure patient has PCP follow-up as I did discuss with patient that this is inappropriate to come to the ER for refills on this medication.  The last person to prescribe his medication was an ER provider.  He has only received a 30-day dose and felt.  He is having no active SI/HI/AVH.  I provided also [facility] resources.  Risk Prescription drug management.      Final Clinical  Impression(s) / ED Diagnoses Final diagnoses:  Medication refill    Rx / DC Orders ED Discharge Orders          Ordered    sertraline (ZOLOFT) 50 MG tablet  Daily        10/01/22 0329              Mardene Sayer, MD 10/01/22 0400

## 2022-10-01 NOTE — ED Triage Notes (Signed)
Pt presents requesting a refill on his Sertraline 50 mg tablets. Pt states he did not realize he was out of medication and he uses it to treat "anger problems caused by seizure medication." Denies any other needs.

## 2022-12-13 ENCOUNTER — Emergency Department (HOSPITAL_BASED_OUTPATIENT_CLINIC_OR_DEPARTMENT_OTHER): Payer: Medicare Other

## 2022-12-13 ENCOUNTER — Emergency Department (HOSPITAL_BASED_OUTPATIENT_CLINIC_OR_DEPARTMENT_OTHER)
Admission: EM | Admit: 2022-12-13 | Discharge: 2022-12-13 | Disposition: A | Discharge status: Home / Self Care | Payer: Medicare Other | Attending: Emergency Medicine | Admitting: Emergency Medicine

## 2022-12-13 DIAGNOSIS — L03113 Cellulitis of right upper limb: Secondary | ICD-10-CM | POA: Diagnosis not present

## 2022-12-13 DIAGNOSIS — Z23 Encounter for immunization: Secondary | ICD-10-CM | POA: Insufficient documentation

## 2022-12-13 DIAGNOSIS — Z760 Encounter for issue of repeat prescription: Secondary | ICD-10-CM | POA: Diagnosis not present

## 2022-12-13 DIAGNOSIS — S60460A Insect bite (nonvenomous) of right index finger, initial encounter: Secondary | ICD-10-CM | POA: Diagnosis present

## 2022-12-13 DIAGNOSIS — W57XXXA Bitten or stung by nonvenomous insect and other nonvenomous arthropods, initial encounter: Secondary | ICD-10-CM | POA: Insufficient documentation

## 2022-12-13 MED ORDER — SERTRALINE HCL 50 MG PO TABS: 50.0000 mg | ORAL_TABLET | Freq: Every day | ORAL | 0 refills | Status: AC

## 2022-12-13 MED ORDER — DOXYCYCLINE HYCLATE 100 MG PO CAPS: 100.0000 mg | ORAL_CAPSULE | Freq: Two times a day (BID) | ORAL | 0 refills | Status: AC

## 2022-12-13 MED ORDER — TETANUS-DIPHTH-ACELL PERTUSSIS 5-2.5-18.5 LF-MCG/0.5 IM SUSY
0.5000 mL | PREFILLED_SYRINGE | Freq: Once | INTRAMUSCULAR | Status: AC
  Administered 2022-12-13: 0.5 mL via INTRAMUSCULAR
  Filled 2022-12-13: qty 0.5

## 2022-12-13 NOTE — ED Provider Notes (Signed)
[place] [facility] Provider Note   CSN: [id number] Arrival date & time: 12/13/22  1609     History  Chief Complaint  Patient presents with   Insect Bite    William Goodman is a 52 y.o. male.  52 year old male presents with concern for spider bite to his right index finger. Patient noted a small splinter looking object in his right index finger 2 days ago. He dug the splinter out and later saw pus in the finger. He squeezed the finger until he got out all of the purulent material and the area was bleeding, woke up this morning with swelling of his hand and finger.  Patient was showing someone his finger and they told him it could be a brown recluse bite.  Patient did not see a spider bite him, last tetanus unknown.  Patient is right-hand dominant.    Also requesting refill of his Zoloft which he states his PCP filled last month and he lost the medication, requesting medication to get him through until he is able to see his PCP at the end of next month.         Home Medications Prior to Admission medications   Medication Sig Start Date End Date Taking? Authorizing Provider  doxycycline (VIBRAMYCIN) 100 MG capsule Take 1 capsule (100 mg total) by mouth 2 (two) times daily. 12/13/22  Yes Jeannie Fend, PA-C  sertraline (ZOLOFT) 50 MG tablet Take 1 tablet (50 mg total) by mouth daily for 10 days. 12/13/22 12/23/22 Yes Jeannie Fend, PA-C  LevETIRAcetam (KEPPRA PO) Take by mouth.    [provider]      Allergies    Codeine    Review of Systems   Review of Systems Negative except as per HPI Physical Exam Updated Vital Signs BP (!) 160/97 (BP Location: Right Arm)   Pulse 90   Temp 98.5 F (36.9 C) (Oral)   Resp 18   Ht 6' (1.829 m)   Wt 90.7 kg   SpO2 97%   BMI 27.12 kg/m  Physical Exam Vitals and nursing note reviewed.  Constitutional:      General: He is not in acute distress.    Appearance: He is well-developed. He is  not diaphoretic.  HENT:     Head: Normocephalic and atraumatic.  Pulmonary:     Effort: Pulmonary effort is normal.  Musculoskeletal:        General: Swelling and tenderness present. No deformity.     Comments: Swelling of the right hand and right index finger.  No active drainage, no streaking.  Patient is able to flex and extend the digits with mild discomfort.  No tenderness with palpation along the flexor or extensor tendons. Open wound to dorsum of right index finger.   Skin:    General: Skin is warm and dry.  Neurological:     Mental Status: He is alert and oriented to person, place, and time.     Sensory: No sensory deficit.  Psychiatric:        Behavior: Behavior normal.          ED Results / Procedures / Treatments   Labs (all labs ordered are listed, but only abnormal results are displayed) Labs Reviewed - No data to display  EKG None  Radiology DG Hand Complete Right  Result Date: 12/13/2022 CLINICAL DATA:  Insect bite, swelling and purulent drainage near second MCP joint EXAM: RIGHT HAND - COMPLETE 3+ VIEW  COMPARISON:  None Available. FINDINGS: Frontal, oblique, and lateral views of the right hand are obtained. No acute fracture, subluxation, or dislocation. 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 Soft tissue swelling at the base of the second digit extending through the dorsum of the hand. No subcutaneous gas, radiopaque foreign body, or evidence of underlying osteomyelitis. IMPRESSION: 1. Soft tissue swelling at the base of the second digit and throughout the dorsum of the right hand, consistent with given history of insect bite. No subcutaneous gas, radiopaque foreign body, or evidence of osteomyelitis. 2. Mild multifocal osteoarthritis as above. Electronically Signed   By: Sharlet Salina M.D.   On: 12/13/2022 16:57    Procedures Procedures    Medications Ordered  in ED Medications  Tdap (BOOSTRIX) injection 0.5 mL (0.5 mLs Intramuscular Given 12/13/22 1701)    ED Course/ Medical Decision Making/ A&P                                 Medical Decision Making Amount and/or Complexity of Data Reviewed Radiology: ordered.  Risk Prescription drug management.   52 year old male presents with primary concern of right finger infection.  Right-hand-dominant male, saw a splinter in his finger and dug it out a few days ago, then developed a pustule on the finger which he squeezed until he got all of the purulent material out and the area was bleeding.  He woke up this morning with a swollen hand and finger, was concerned that he had a brown recluse bite.  Discussed with patient I suspect his hand swelling is secondary to all of the squeezing at the finger he did yesterday.  Monitor the wound, soak in Epsom salt, take antibiotics as prescribed.  Return to ER for worsening or concerning symptoms otherwise follow-up with hand.  Secondary complaint is a request for refill of his Zoloft.  Patient states that he got this from his primary care provider last month but [place] the [place].  On chart review, patient has filled this prescription through various emergency room's over the past several years.  I am unable to see a prescription that is provided by a PCP.  Call to patient's pharmacy who verified the last prescription they filled was from the emergency room in June.  Advised patient this medication is to managed by primary care, he is provided with a 10-day course and given information for [facility] if he is unable to see his PCP.        Final Clinical Impression(s) / ED Diagnoses Final diagnoses:  Cellulitis of right upper extremity  Medication refill    Rx / DC Orders ED Discharge Orders          Ordered    doxycycline (VIBRAMYCIN) 100 MG capsule  2 times daily        12/13/22 1714    sertraline (ZOLOFT) 50 MG tablet   Daily        12/13/22 1714              Jeannie Fend, PA-C 12/13/22 1810    Charlynne Pander, MD 12/13/22 [phone]

## 2022-12-13 NOTE — Discharge Instructions (Addendum)
Your Zoloft prescription could not be verified by your pharmacy. This medication has been filled for 10 days and requires further prescription from a primary care provider. Please follow up with your primary care provider. You can also reach out to [facility] any time for urgent behavioral health needs.   Take antibiotics as prescribed. Warm epsom salt water soaks for 20 minutes at a time. Do NOT try and squeeze anything out of the finger. Follow up with hand specialist, call tomorrow morning to schedule an appointment with Dr. Melvyn Novas at [facility].

## 2022-12-13 NOTE — ED Triage Notes (Signed)
Pt reports to ED with complaints of  losing his medication refill for this sertraline medication and he also has bug bite on his right index finger. Thinks that it may have been a brown recluse.

## 2022-12-24 ENCOUNTER — Encounter (HOSPITAL_BASED_OUTPATIENT_CLINIC_OR_DEPARTMENT_OTHER): Payer: Self-pay | Admitting: Emergency Medicine

## 2022-12-24 ENCOUNTER — Emergency Department (HOSPITAL_BASED_OUTPATIENT_CLINIC_OR_DEPARTMENT_OTHER)
Admission: EM | Admit: 2022-12-24 | Discharge: 2022-12-24 | Disposition: A | Discharge status: Home / Self Care | Payer: Medicare Other | Attending: Emergency Medicine | Admitting: Emergency Medicine

## 2022-12-24 ENCOUNTER — Other Ambulatory Visit: Payer: Self-pay

## 2022-12-24 DIAGNOSIS — M25562 Pain in left knee: Secondary | ICD-10-CM | POA: Insufficient documentation

## 2022-12-24 NOTE — ED Provider Notes (Signed)
   [facility]  Provider Note  CSN: [id number] Arrival date & time: 12/24/22 3474  History Chief Complaint  Patient presents with   Knee Pain    William Goodman is a 52 y.o. male here for atraumatic L knee pain, he initially said had started 3-4 days ago but he was seen for same at [facility] a week ago (when xrays were done and negative). No swelling.    Home Medications Prior to Admission medications   Medication Sig Start Date End Date Taking? Authorizing Provider  doxycycline (VIBRAMYCIN) 100 MG capsule Take 1 capsule (100 mg total) by mouth 2 (two) times daily. 12/13/22   Jeannie Fend, PA-C  LevETIRAcetam (KEPPRA PO) Take by mouth.    [provider]  sertraline (ZOLOFT) 50 MG tablet Take 1 tablet (50 mg total) by mouth daily for 10 days. 12/13/22 12/23/22  Jeannie Fend, PA-C     Allergies    Codeine   Review of Systems   Review of Systems Please see HPI for pertinent positives and negatives  Physical Exam BP (!) 143/92   Pulse 75   Temp 98.1 F (36.7 C)   Resp 18   Ht 6' (1.829 m)   Wt 93 kg   SpO2 97%   BMI 27.80 kg/m   Physical Exam Vitals and nursing note reviewed.  HENT:     Head: Normocephalic.     Nose: Nose normal.  Eyes:     Extraocular Movements: Extraocular movements intact.  Pulmonary:     Effort: Pulmonary effort is normal.  Musculoskeletal:        General: Tenderness (mild, medial L knee) present. No swelling or deformity. Normal range of motion.     Cervical back: Neck supple.  Skin:    Findings: No rash (on exposed skin).  Neurological:     Mental Status: He is alert and oriented to person, place, and time.  Psychiatric:        Mood and Affect: Mood normal.     ED Results / Procedures / Treatments   EKG None  Procedures Procedures  Medications Ordered in the ED Medications - No data to display  Initial Impression and Plan  Patient here with L knee pain, ongoing at least a week  or more. No known injury. Exam is normal. Will give a knee brace for comfort. Recommend OTC pain medications if needed and follow up with ortho for further management.   ED Course       MDM Rules/Calculators/A&P Medical Decision Making Problems Addressed: Left knee pain, unspecified chronicity: self-limited or minor problem  Amount and/or Complexity of Data Reviewed External Data Reviewed: radiology and notes.  Risk OTC drugs.     Final Clinical Impression(s) / ED Diagnoses Final diagnoses:  Left knee pain, unspecified chronicity    Rx / DC Orders ED Discharge Orders     None        Pollyann Savoy, MD 12/24/22 [phone]

## 2022-12-24 NOTE — ED Triage Notes (Signed)
L knee pain since Wednesday with no known injury. Pt states he just wants to get it checked out because his boss noticed he was limping.
